# Patient Record
Sex: Female | Born: 1990 | Race: Black or African American | Hispanic: No | Marital: Single | Smoking: Never smoker
Health system: Southern US, Community
[De-identification: ages and names within clinical notes are randomized; demographics above are authoritative.]

## PROBLEM LIST (undated history)

## (undated) DIAGNOSIS — I10 Essential (primary) hypertension: Secondary | ICD-10-CM

## (undated) HISTORY — PX: FACIAL FRACTURE SURGERY: SHX1570

---

## 2013-09-08 ENCOUNTER — Emergency Department (HOSPITAL_BASED_OUTPATIENT_CLINIC_OR_DEPARTMENT_OTHER)
Admission: EM | Admit: 2013-09-08 | Discharge: 2013-09-08 | Disposition: A | Discharge status: Home / Self Care | Payer: Medicaid Other | Attending: Emergency Medicine | Admitting: Emergency Medicine

## 2013-09-08 ENCOUNTER — Encounter (HOSPITAL_BASED_OUTPATIENT_CLINIC_OR_DEPARTMENT_OTHER): Payer: Self-pay | Admitting: Emergency Medicine

## 2013-09-08 DIAGNOSIS — Z3202 Encounter for pregnancy test, result negative: Secondary | ICD-10-CM | POA: Insufficient documentation

## 2013-09-08 DIAGNOSIS — I10 Essential (primary) hypertension: Secondary | ICD-10-CM | POA: Insufficient documentation

## 2013-09-08 DIAGNOSIS — R110 Nausea: Secondary | ICD-10-CM | POA: Insufficient documentation

## 2013-09-08 DIAGNOSIS — N390 Urinary tract infection, site not specified: Secondary | ICD-10-CM | POA: Insufficient documentation

## 2013-09-08 HISTORY — DX: Essential (primary) hypertension: I10

## 2013-09-08 LAB — URINE MICROSCOPIC-ADD ON

## 2013-09-08 LAB — BASIC METABOLIC PANEL
BUN: 11 mg/dL (ref 6–23)
Chloride: 98 mEq/L (ref 96–112)
Creatinine, Ser: 0.9 mg/dL (ref 0.50–1.10)
GFR calc Af Amer: >90 mL/min (ref >90)
GFR calc non Af Amer: >90 mL/min (ref >90)
Glucose, Bld: 95 mg/dL (ref 70–99)
Potassium: 3.7 mEq/L (ref 3.5–5.1)
Sodium: 137 mEq/L (ref 135–145)

## 2013-09-08 LAB — PREGNANCY, URINE: Preg Test, Ur: NEGATIVE

## 2013-09-08 LAB — CBC WITH DIFFERENTIAL/PLATELET
Basophils Relative: 0 % (ref 0–1)
Eosinophils Absolute: 0 10*3/uL (ref 0.0–0.7)
Hemoglobin: 13.9 g/dL (ref 12.0–15.0)
Lymphs Abs: 1.6 10*3/uL (ref 0.7–4.0)
MCH: 29.2 pg (ref 26.0–34.0)
MCV: 87.4 fL (ref 78.0–100.0)
Monocytes Relative: 12 % (ref 3–12)
Neutro Abs: 3.8 10*3/uL (ref 1.7–7.7)
Neutrophils Relative %: 62 % (ref 43–77)
Platelets: 244 10*3/uL (ref 150–400)
RBC: 4.76 MIL/uL (ref 3.87–5.11)

## 2013-09-08 LAB — URINALYSIS, ROUTINE W REFLEX MICROSCOPIC
Glucose, UA: NEGATIVE mg/dL
Nitrite: NEGATIVE
Specific Gravity, Urine: 1.029 (ref 1.005–1.030)
pH: 5.5 (ref 5.0–8.0)

## 2013-09-08 MED ORDER — SODIUM CHLORIDE 0.9 % IV BOLUS (SEPSIS)
1000.0000 mL | Freq: Once | INTRAVENOUS | Status: AC
  Administered 2013-09-08: 1000 mL via INTRAVENOUS

## 2013-09-08 MED ORDER — ACETAMINOPHEN 325 MG PO TABS
650.0000 mg | ORAL_TABLET | Freq: Once | ORAL | Status: DC
  Filled 2013-09-08: qty 2

## 2013-09-08 MED ORDER — CEPHALEXIN 500 MG PO CAPS: 500.0000 mg | ORAL_CAPSULE | Freq: Four times a day (QID) | ORAL | Status: DC

## 2013-09-08 MED ORDER — CEFTRIAXONE SODIUM 1 G IJ SOLR
INTRAMUSCULAR | Status: AC
  Filled 2013-09-08: qty 10

## 2013-09-08 MED ORDER — ACETAMINOPHEN 325 MG PO TABS
650.0000 mg | ORAL_TABLET | Freq: Once | ORAL | Status: AC
  Administered 2013-09-08: 650 mg via ORAL

## 2013-09-08 MED ORDER — DEXTROSE 5 % IV SOLN
1.0000 g | Freq: Once | INTRAVENOUS | Status: AC
  Administered 2013-09-08: 1 g via INTRAVENOUS

## 2013-09-08 NOTE — ED Notes (Signed)
No adverse effects noted to IV antibiotic.

## 2013-09-08 NOTE — ED Provider Notes (Signed)
Medical screening examination/treatment/procedure(s) were performed by non-physician practitioner and as supervising physician I was immediately available for consultation/collaboration.  EKG Interpretation   None        Charles B. Sheldon, MD 09/08/13 2046 

## 2013-09-08 NOTE — ED Provider Notes (Signed)
CSN: [id number]     Arrival date & time 09/08/13  1642 History   First MD Initiated Contact with Patient 09/08/13 1759     Chief Complaint  Patient presents with  . Dysuria   (Consider location/radiation/quality/duration/timing/severity/associated sxs/prior Treatment) Patient is a 22 y.o. female presenting with dysuria. The history is provided by the patient. No language interpreter was used.  Dysuria Pain quality:  Aching and burning Associated symptoms: fever and nausea   Associated symptoms: no abdominal pain, no flank pain, no vaginal discharge and no vomiting   Associated symptoms comment:  Burning with urination for the past 2 days. She denies vaginal discharge, abnormal bleeding, abdominal or pelvic pain. She has had mild nausea without vomiting. She reports only known fever was on arrival in ED, 102. She denies congestion, cough, sore throat, flank pain.   Past Medical History  Diagnosis Date  . Hypertension    History reviewed. No pertinent past surgical history. No family history on file. History  Substance Use Topics  . Smoking status: Never Smoker   . Smokeless tobacco: Not on file  . Alcohol Use: Not on file   OB History   Grav Para Term Preterm Abortions TAB SAB Ect Mult Living                 Review of Systems  Constitutional: Positive for fever.  HENT: Negative for congestion and sore throat.   Respiratory: Negative for cough and shortness of breath.   Gastrointestinal: Positive for nausea. Negative for vomiting and abdominal pain.  Genitourinary: Positive for dysuria and frequency. Negative for flank pain, vaginal bleeding, vaginal discharge, pelvic pain and dyspareunia.  Musculoskeletal: Negative for myalgias.  Neurological: Negative for headaches.    Allergies  Review of patient's allergies indicates no known allergies.  Home Medications  No current outpatient prescriptions on file. BP 141/73  Pulse 115  Temp(Src) 100.3 F (37.9 C) (Oral)  Resp  18  Wt 325 lb 6.4 oz (147.6 kg)  SpO2 99%  LMP 07/02/2013 Physical Exam  Constitutional: She is oriented to person, place, and time. She appears well-developed and well-nourished. No distress.  HENT:  Head: Normocephalic.  Mouth/Throat: Oropharynx is clear and moist.  Neck: Normal range of motion.  Cardiovascular: Normal rate and regular rhythm.   No murmur heard. Pulmonary/Chest: Effort normal. No respiratory distress. She has no wheezes. She has no rales.  Abdominal: Soft. There is no tenderness. There is no rebound.  Neurological: She is alert and oriented to person, place, and time.  Skin: Skin is warm and dry.  Psychiatric: She has a normal mood and affect.    ED Course  Procedures (including critical care time) Labs Review Labs Reviewed  URINALYSIS, ROUTINE W REFLEX MICROSCOPIC - Abnormal; Notable for the following:    APPearance CLOUDY (*)    Hgb urine dipstick SMALL (*)    Protein, ur 30 (*)    Leukocytes, UA MODERATE (*)    All other components within normal limits  URINE MICROSCOPIC-ADD ON - Abnormal; Notable for the following:    Squamous Epithelial / LPF FEW (*)    Bacteria, UA MANY (*)    All other components within normal limits  URINE CULTURE  PREGNANCY, URINE  CBC WITH DIFFERENTIAL  BASIC METABOLIC PANEL   Results for orders placed during the hospital encounter of 09/08/13  URINALYSIS, ROUTINE W REFLEX MICROSCOPIC      Result Value Range   Color, Urine YELLOW  YELLOW   APPearance CLOUDY (*)  CLEAR   Specific Gravity, Urine 1.029  1.005 - 1.030   pH 5.5  5.0 - 8.0   Glucose, UA NEGATIVE  NEGATIVE mg/dL   Hgb urine dipstick SMALL (*) NEGATIVE   Bilirubin Urine NEGATIVE  NEGATIVE   Ketones, ur NEGATIVE  NEGATIVE mg/dL   Protein, ur 30 (*) NEGATIVE mg/dL   Urobilinogen, UA 0.2  0.0 - 1.0 mg/dL   Nitrite NEGATIVE  NEGATIVE   Leukocytes, UA MODERATE (*) NEGATIVE  PREGNANCY, URINE      Result Value Range   Preg Test, Ur NEGATIVE  NEGATIVE  URINE  MICROSCOPIC-ADD ON      Result Value Range   Squamous Epithelial / LPF FEW (*) RARE   WBC, UA 3-6  <3 WBC/hpf   RBC / HPF 0-2  <3 RBC/hpf   Bacteria, UA MANY (*) RARE   Urine-Other MUCOUS PRESENT    CBC WITH DIFFERENTIAL      Result Value Range   WBC 6.1  4.0 - 10.5 K/uL   RBC 4.76  3.87 - 5.11 MIL/uL   Hemoglobin 13.9  12.0 - 15.0 g/dL   HCT 21.3  08.6 - 57.8 %   MCV 87.4  78.0 - 100.0 fL   MCH 29.2  26.0 - 34.0 pg   MCHC 33.4  30.0 - 36.0 g/dL   RDW 46.9  62.9 - 52.8 %   Platelets 244  150 - 400 K/uL   Neutrophils Relative % 62  43 - 77 %   Neutro Abs 3.8  1.7 - 7.7 K/uL   Lymphocytes Relative 26  12 - 46 %   Lymphs Abs 1.6  0.7 - 4.0 K/uL   Monocytes Relative 12  3 - 12 %   Monocytes Absolute 0.8  0.1 - 1.0 K/uL   Eosinophils Relative 0  0 - 5 %   Eosinophils Absolute 0.0  0.0 - 0.7 K/uL   Basophils Relative 0  0 - 1 %   Basophils Absolute 0.0  0.0 - 0.1 K/uL  BASIC METABOLIC PANEL      Result Value Range   Sodium 137  135 - 145 mEq/L   Potassium 3.7  3.5 - 5.1 mEq/L   Chloride 98  96 - 112 mEq/L   CO2 26  19 - 32 mEq/L   Glucose, Bld 95  70 - 99 mg/dL   BUN 11  6 - 23 mg/dL   Creatinine, Ser 4.13  0.50 - 1.10 mg/dL   Calcium 9.2  8.4 - 24.4 mg/dL   GFR calc non Af Amer >90  >90 mL/min   GFR calc Af Amer >90  >90 mL/min    Imaging Review No results found.  EKG Interpretation   None       MDM  No diagnosis found. 1. UTI  FEver improved, IV fluids given with improved VS. She feels better. Lab studies unremarkable. Stable for discharge. She has community follow up for recheck this week.     Arnoldo Hooker, PA-C 09/08/13 2032

## 2013-09-08 NOTE — ED Notes (Signed)
Patient here with dysuria and frequency x 2 days, chills with same, denies discharge

## 2013-09-10 LAB — URINE CULTURE

## 2013-11-09 ENCOUNTER — Encounter (HOSPITAL_BASED_OUTPATIENT_CLINIC_OR_DEPARTMENT_OTHER): Payer: Self-pay | Admitting: Emergency Medicine

## 2013-11-09 ENCOUNTER — Emergency Department (HOSPITAL_BASED_OUTPATIENT_CLINIC_OR_DEPARTMENT_OTHER)
Admission: EM | Admit: 2013-11-09 | Discharge: 2013-11-09 | Disposition: A | Discharge status: Home / Self Care | Payer: Medicaid Other | Attending: Emergency Medicine | Admitting: Emergency Medicine

## 2013-11-09 DIAGNOSIS — N760 Acute vaginitis: Secondary | ICD-10-CM | POA: Insufficient documentation

## 2013-11-09 DIAGNOSIS — B9689 Other specified bacterial agents as the cause of diseases classified elsewhere: Secondary | ICD-10-CM | POA: Insufficient documentation

## 2013-11-09 DIAGNOSIS — I498 Other specified cardiac arrhythmias: Secondary | ICD-10-CM | POA: Insufficient documentation

## 2013-11-09 DIAGNOSIS — I10 Essential (primary) hypertension: Secondary | ICD-10-CM | POA: Insufficient documentation

## 2013-11-09 DIAGNOSIS — A499 Bacterial infection, unspecified: Secondary | ICD-10-CM | POA: Insufficient documentation

## 2013-11-09 DIAGNOSIS — Z792 Long term (current) use of antibiotics: Secondary | ICD-10-CM | POA: Insufficient documentation

## 2013-11-09 DIAGNOSIS — Z202 Contact with and (suspected) exposure to infections with a predominantly sexual mode of transmission: Secondary | ICD-10-CM

## 2013-11-09 LAB — WET PREP, GENITAL
Trich, Wet Prep: NONE SEEN
Yeast Wet Prep HPF POC: NONE SEEN

## 2013-11-09 MED ORDER — AZITHROMYCIN 250 MG PO TABS
1000.0000 mg | ORAL_TABLET | Freq: Once | ORAL | Status: AC
Start: 2013-11-09 — End: 2013-11-09
  Administered 2013-11-09: 1000 mg via ORAL
  Filled 2013-11-09: qty 4

## 2013-11-09 MED ORDER — METRONIDAZOLE 500 MG PO TABS: 500.0000 mg | ORAL_TABLET | Freq: Two times a day (BID) | ORAL | Status: AC

## 2013-11-09 NOTE — ED Notes (Signed)
Pelvic cart is at the bedside set up and ready for the doctor to use. 

## 2013-11-09 NOTE — ED Notes (Signed)
Pt states she wants to be checked for STD's.   Pt denies vaginal discharge.

## 2013-11-09 NOTE — ED Provider Notes (Signed)
CSN: [id number]     Arrival date & time 11/09/13  1256 History   First MD Initiated Contact with Patient 11/09/13 1306     Chief Complaint  Patient presents with  . Exposure to STD   (Consider location/radiation/quality/duration/timing/severity/associated sxs/prior Treatment) Patient is a 23 y.o. female presenting with STD exposure. The history is provided by the patient.  Exposure to STD This is a new problem. The current episode started in the past 7 days.   Sheryl Little is a 23 y.o. female who presents to the ED requesting STD testing. She states that her boyfriend called her and told her he had nongonococcal urethritis and she needed to come in for STD testing.  She denies any symptoms. Menses started today.     Past Medical History  Diagnosis Date  . Hypertension    History reviewed. No pertinent past surgical history. No family history on file. History  Substance Use Topics  . Smoking status: Never Smoker   . Smokeless tobacco: Not on file  . Alcohol Use: No   OB History   Grav Para Term Preterm Abortions TAB SAB Ect Mult Living                 Review of Systems Negative as stated in HPI  Allergies  Review of patient's allergies indicates no known allergies.  Home Medications   Current Outpatient Rx  Name  Route  Sig  Dispense  Refill  . cephALEXin (KEFLEX) 500 MG capsule   Oral   Take 1 capsule (500 mg total) by mouth 4 (four) times daily.   20 capsule   0    BP 121/65  Pulse 57  Temp(Src) 98.8 F (37.1 C) (Oral)  Ht 5\' 4"  (1.626 m)  SpO2 96%  LMP 10/04/2013 Physical Exam  Nursing note and vitals reviewed. Constitutional: She is oriented to person, place, and time. She appears well-developed and well-nourished.  HENT:  Head: Normocephalic and atraumatic.  Eyes: EOM are normal.  Neck: Neck supple.  Cardiovascular: Bradycardia present.   Pulmonary/Chest: Effort normal.  Abdominal: Soft. There is no tenderness.  Genitourinary:  External  genitalia without lesions. Small amount of vaginal bleeding. No CMT, no adnexal tenderness, uterus without palpable enlargement.   Musculoskeletal: Normal range of motion.  Neurological: She is alert and oriented to person, place, and time. No cranial nerve deficit.  Skin: Skin is warm and dry.  Psychiatric: She has a normal mood and affect. Her behavior is normal.   Results for orders placed during the hospital encounter of 11/09/13 (from the past 24 hour(s))  WET PREP, GENITAL     Status: Abnormal   Collection Time    11/09/13  2:19 PM      Result Value Range   Yeast Wet Prep HPF POC NONE SEEN  NONE SEEN   Trich, Wet Prep NONE SEEN  NONE SEEN   Clue Cells Wet Prep HPF POC MODERATE (*) NONE SEEN   WBC, Wet Prep HPF POC MANY (*) NONE SEEN     ED Course  Procedures   MDM  23 y.o. female with STD exposure. Cultures for GC and Chlamydia sent. Will give Zithromax 1 gram here prior to discharge. Will treat for BV. I have reviewed this patient's vital signs, nurses notes, appropriate labs and discussed findings and plan of care with the patient. She will follow up with the health department for further screening for HIV, Hepatitis and syphilis.    Medication List    TAKE  these medications       metroNIDAZOLE 500 MG tablet  Commonly known as:  FLAGYL  Take 1 tablet (500 mg total) by mouth 2 (two) times daily.      ASK your doctor about these medications       cephALEXin 500 MG capsule  Commonly known as:  KEFLEX  Take 1 capsule (500 mg total) by mouth 4 (four) times daily.           [address] [place], [place] 11/10/13 [phone]

## 2013-11-09 NOTE — ED Notes (Signed)
Here requesting an evaluation, states her boyfriend tested positive for Non-Gonococcal Urethritis.

## 2013-11-09 NOTE — Discharge Instructions (Signed)
We are treating you with an antibiotic here today to cover you for Chlamydia. We are giving you a prescription for an antibiotic for your bacterial vaginosis. You can follow up with the Health Department for further STD tests such as HIV, Hepatitis and Syphilis. Return here as needed for problems. If you need additional antibiotics when the cultures are back someone will call you.

## 2013-11-10 NOTE — ED Provider Notes (Signed)
Medical screening examination/treatment/procedure(s) were performed by non-physician practitioner and as supervising physician I was immediately available for consultation/collaboration.  EKG Interpretation   None         Sheryl B. Bernette MayersSheldon, MD 11/10/13 [phone]

## 2013-11-12 LAB — GC/CHLAMYDIA PROBE AMP
CT PROBE, AMP APTIMA: POSITIVE — AB
GC Probe RNA: NEGATIVE

## 2013-11-13 NOTE — ED Notes (Addendum)
+   Chlamydia Patient [facility] with [facility]ax [facility]

## 2014-03-12 ENCOUNTER — Emergency Department (HOSPITAL_BASED_OUTPATIENT_CLINIC_OR_DEPARTMENT_OTHER)
Admission: EM | Admit: 2014-03-12 | Discharge: 2014-03-12 | Disposition: A | Discharge status: Home / Self Care | Payer: Medicaid Other | Attending: Emergency Medicine | Admitting: Emergency Medicine

## 2014-03-12 ENCOUNTER — Encounter (HOSPITAL_BASED_OUTPATIENT_CLINIC_OR_DEPARTMENT_OTHER): Payer: Self-pay | Admitting: Emergency Medicine

## 2014-03-12 DIAGNOSIS — R21 Rash and other nonspecific skin eruption: Secondary | ICD-10-CM | POA: Insufficient documentation

## 2014-03-12 DIAGNOSIS — I10 Essential (primary) hypertension: Secondary | ICD-10-CM | POA: Insufficient documentation

## 2014-03-12 DIAGNOSIS — Z792 Long term (current) use of antibiotics: Secondary | ICD-10-CM | POA: Diagnosis not present

## 2014-03-12 MED ORDER — SULFAMETHOXAZOLE-TRIMETHOPRIM 800-160 MG PO TABS: 1.0000 | ORAL_TABLET | Freq: Two times a day (BID) | ORAL | Status: AC

## 2014-03-12 MED ORDER — CEPHALEXIN 500 MG PO CAPS: 500.0000 mg | ORAL_CAPSULE | Freq: Four times a day (QID) | ORAL | Status: DC

## 2014-03-12 NOTE — ED Provider Notes (Signed)
CSN: [id number]     Arrival date & time 03/12/14  1127 History   First MD Initiated Contact with Patient 03/12/14 1222     Chief Complaint  Patient presents with  . Rash     (Consider location/radiation/quality/duration/timing/severity/associated sxs/prior Treatment) HPI Comments: Pt states that she started with an red bump on her right eyelid 2 days ago. Pt state it looked like a bite and she has been squeezing the area and it seem to be getting worse. Pt states that it is a little bit sore today. Denies vision problems. No fever. No eye drainage.pt states that the area has been a little crusted  The history is provided by the patient. No language interpreter was used.    Past Medical History  Diagnosis Date  . Hypertension    Past Surgical History  Procedure Laterality Date  . Facial fracture surgery     No family history on file. History  Substance Use Topics  . Smoking status: Never Smoker   . Smokeless tobacco: Not on file  . Alcohol Use: No   OB History   Grav Para Term Preterm Abortions TAB SAB Ect Mult Living                 Review of Systems  Constitutional: Negative.   Respiratory: Negative.   Cardiovascular: Negative.       Allergies  Review of patient's allergies indicates no known allergies.  Home Medications   Prior to Admission medications   Medication Sig Start Date End Date Taking? Authorizing Provider  cephALEXin (KEFLEX) 500 MG capsule Take 1 capsule (500 mg total) by mouth 4 (four) times daily. 09/08/13   Shari A Upstill, PA-C  cephALEXin (KEFLEX) 500 MG capsule Take 1 capsule (500 mg total) by mouth 4 (four) times daily. 03/12/14   Teressa Lower, NP  metroNIDAZOLE (FLAGYL) 500 MG tablet Take 1 tablet (500 mg total) by mouth 2 (two) times daily. 11/09/13   Hope Orlene Och, NP  sulfamethoxazole-trimethoprim (SEPTRA DS) 800-160 MG per tablet Take 1 tablet by mouth every 12 (twelve) hours. 03/12/14   Teressa Lower, NP   BP 144/73  Pulse 104   Temp(Src) 99 F (37.2 C) (Oral)  Resp 20  Ht 5\' 4"  (1.626 m)  Wt 325 lb (147.419 kg)  BMI 55.76 kg/m2  SpO2 99%  LMP 02/10/2014 Physical Exam  Nursing note and vitals reviewed. Constitutional: She is oriented to person, place, and time. She appears well-developed and well-nourished.  Eyes: Conjunctivae and EOM are normal. Pupils are equal, round, and reactive to light.  Cardiovascular: Normal rate and regular rhythm.   Pulmonary/Chest: Effort normal and breath sounds normal.  Neurological: She is alert and oriented to person, place, and time.  Skin:  1cm in diameter area to the right eyelid with crusting and drainage    ED Course  Procedures (including critical care time) Labs Review Labs Reviewed - No data to display  Imaging Review No results found.   EKG Interpretation None      MDM   Final diagnoses:  Rash    Considered shingles although not consistent with that. More consistent with folliculitis. No conjunctival involvement. Discussed return symptoms with pt and she verbalized understanding    Teressa Lower, NP 03/12/14 1244

## 2014-03-12 NOTE — ED Notes (Signed)
Rash on right eye lid that started 2 days ago.  States she thinks she was bitten by something.

## 2014-03-12 NOTE — Discharge Instructions (Signed)

## 2014-03-13 NOTE — ED Provider Notes (Signed)
Medical screening examination/treatment/procedure(s) were performed by non-physician practitioner and as supervising physician I was immediately available for consultation/collaboration.   EKG Interpretation None        Candyce Churn III, MD 03/13/14 [phone]

## 2014-05-30 ENCOUNTER — Emergency Department (HOSPITAL_BASED_OUTPATIENT_CLINIC_OR_DEPARTMENT_OTHER)
Admission: EM | Admit: 2014-05-30 | Discharge: 2014-05-30 | Disposition: A | Discharge status: Home / Self Care | Payer: Medicaid Other | Attending: Emergency Medicine | Admitting: Emergency Medicine

## 2014-05-30 ENCOUNTER — Encounter (HOSPITAL_BASED_OUTPATIENT_CLINIC_OR_DEPARTMENT_OTHER): Payer: Self-pay | Admitting: Emergency Medicine

## 2014-05-30 DIAGNOSIS — I10 Essential (primary) hypertension: Secondary | ICD-10-CM | POA: Insufficient documentation

## 2014-05-30 DIAGNOSIS — M545 Low back pain, unspecified: Secondary | ICD-10-CM | POA: Diagnosis not present

## 2014-05-30 DIAGNOSIS — Z792 Long term (current) use of antibiotics: Secondary | ICD-10-CM | POA: Diagnosis not present

## 2014-05-30 DIAGNOSIS — M722 Plantar fascial fibromatosis: Secondary | ICD-10-CM | POA: Insufficient documentation

## 2014-05-30 NOTE — ED Provider Notes (Signed)
CSN: [id number]     Arrival date & time 05/30/14  1422 History   First MD Initiated Contact with Patient 05/30/14 1506     Chief Complaint  Patient presents with  . Back Pain     (Consider location/radiation/quality/duration/timing/severity/associated sxs/prior Treatment) Patient is a 23 y.o. female presenting with back pain. The history is provided by the patient.  Back Pain Location:  Lumbar spine Quality:  Aching Pain severity:  Moderate Pain is:  Same all the time Onset quality:  Gradual Timing:  Constant Progression:  Waxing and waning Context: not falling, not jumping from heights, not lifting heavy objects, not MVA, not recent illness, not recent injury and not twisting   Relieved by:  Being still Worsened by:  Sitting and twisting Ineffective treatments:  None tried Associated symptoms: no abdominal pain, no abdominal swelling, no bladder incontinence, no bowel incontinence, no chest pain, no fever, no headaches, no leg pain, no numbness, no paresthesias, no tingling, no weakness and no weight loss   Risk factors: no hx of cancer, no hx of osteoporosis, no lack of exercise, no menopause, not obese, not pregnant, no recent surgery, no steroid use and no vascular disease     Past Medical History  Diagnosis Date  . Hypertension    Past Surgical History  Procedure Laterality Date  . Facial fracture surgery     No family history on file. History  Substance Use Topics  . Smoking status: Never Smoker   . Smokeless tobacco: Not on file  . Alcohol Use: No   OB History   Grav Para Term Preterm Abortions TAB SAB Ect Mult Living                 Review of Systems  Constitutional: Negative for fever and weight loss.  Cardiovascular: Negative for chest pain.  Gastrointestinal: Negative for abdominal pain and bowel incontinence.  Genitourinary: Negative for bladder incontinence.  Musculoskeletal: Positive for back pain.  Neurological: Negative for tingling, weakness,  numbness, headaches and paresthesias.  All other systems reviewed and are negative.     Allergies  Review of patient's allergies indicates no known allergies.  Home Medications   Prior to Admission medications   Medication Sig Start Date End Date Taking? Authorizing Provider  cephALEXin (KEFLEX) 500 MG capsule Take 1 capsule (500 mg total) by mouth 4 (four) times daily. 09/08/13   Shari A Upstill, PA-C  cephALEXin (KEFLEX) 500 MG capsule Take 1 capsule (500 mg total) by mouth 4 (four) times daily. 03/12/14   Teressa Lower, NP  metroNIDAZOLE (FLAGYL) 500 MG tablet Take 1 tablet (500 mg total) by mouth 2 (two) times daily. 11/09/13   Hope Orlene Och, NP  sulfamethoxazole-trimethoprim (SEPTRA DS) 800-160 MG per tablet Take 1 tablet by mouth every 12 (twelve) hours. 03/12/14   Teressa Lower, NP   BP 119/85  Pulse 97  Temp(Src) 98.9 F (37.2 C)  Resp 18  Wt 260 lb (117.935 kg)  SpO2 100%  LMP 04/16/2014 Physical Exam  Nursing note and vitals reviewed. Constitutional: She is oriented to person, place, and time. She appears well-developed and well-nourished.  Morbidly obese  HENT:  Head: Normocephalic and atraumatic.  Right Ear: External ear normal.  Left Ear: External ear normal.  Nose: Nose normal.  Mouth/Throat: Oropharynx is clear and moist.  Eyes: Conjunctivae and EOM are normal. Pupils are equal, round, and reactive to light.  Neck: Normal range of motion. Neck supple.  Cardiovascular: Normal rate, regular rhythm, normal heart sounds  and intact distal pulses.   Pulmonary/Chest: Effort normal and breath sounds normal.  Abdominal: Soft. Bowel sounds are normal.  Musculoskeletal: Normal range of motion.       Feet:  Right foot pain increased with flexion and extension No calf or leg swelling, ttp, or redness Pulsed intact  Neurological: She is alert and oriented to person, place, and time. She has normal reflexes. She displays normal reflexes. No cranial nerve deficit. She  exhibits normal muscle tone. Coordination normal.  Skin: Skin is warm and dry.  Psychiatric: She has a normal mood and affect. Her behavior is normal. Judgment and thought content normal.    ED Course  Procedures (including critical care time)   MDM   Final diagnoses:  Midline low back pain without sciatica  Plantar fasciitis, right   23 y.o. Female with morbid obesity with back pain for weeks gradually worsening with no trauma that appears musculoskeletal in origin.  Patient with back pain.  No neurological deficits and normal neuro exam.  Patient can walk with normal gait.  No loss of bowel orbladder control.  No concern for cauda equina.  No fever, night sweats, weight loss, h/o cancer, IVDU.  RICE protocol and pain medicine indicated and discussed with patient. Patient with some ttp right heel and symptoms c.w. Plantar fasciitis.      Hilario Quarryanielle S Emanuella Nickle, MD 06/02/14 [phone]

## 2014-05-30 NOTE — ED Notes (Signed)
C/o low mid back pain and right foot swelling x 2 months. Also c/o abd cramping for 2 months. Had a 3 wk period last month. Denies injury to back.

## 2014-05-30 NOTE — Discharge Instructions (Signed)
Back Exercises Back exercises help treat and prevent back injuries. The goal of back exercises is to increase the strength of your abdominal and back muscles and the flexibility of your back. These exercises should be started when you no longer have back pain. Back exercises include:  Pelvic Tilt. Lie on your back with your knees bent. Tilt your pelvis until the lower part of your back is against the floor. Hold this position 5 to 10 sec and repeat 5 to 10 times.  Knee to Chest. Pull first 1 knee up against your chest and hold for 20 to 30 seconds, repeat this with the other knee, and then both knees. This may be done with the other leg straight or bent, whichever feels better.  Sit-Ups or Curl-Ups. Bend your knees 90 degrees. Start with tilting your pelvis, and do a partial, slow sit-up, lifting your trunk only 30 to 45 degrees off the floor. Take at least 2 to 3 seconds for each sit-up. Do not do sit-ups with your knees out straight. If partial sit-ups are difficult, simply do the above but with only tightening your abdominal muscles and holding it as directed.  Hip-Lift. Lie on your back with your knees flexed 90 degrees. Push down with your feet and shoulders as you raise your hips a couple inches off the floor; hold for 10 seconds, repeat 5 to 10 times.  Back arches. Lie on your stomach, propping yourself up on bent elbows. Slowly press on your hands, causing an arch in your low back. Repeat 3 to 5 times. Any initial stiffness and discomfort should lessen with repetition over time.  Shoulder-Lifts. Lie face down with arms beside your body. Keep hips and torso pressed to floor as you slowly lift your head and shoulders off the floor. Do not overdo your exercises, especially in the beginning. Exercises may cause you some mild back discomfort which lasts for a few minutes; however, if the pain is more severe, or lasts for more than 15 minutes, do not continue exercises until you see your caregiver.  Improvement with exercise therapy for back problems is slow.  See your caregivers for assistance with developing a proper back exercise program. Document Released: 11/10/2004 Document Revised: 12/26/2011 Document Reviewed: 08/04/2011 [facility] Patient Information 2015 [place], [place]. This information is not intended to replace advice given to you by your health care provider. Make sure you discuss any questions you have with your health care provider. Plantar Fasciitis Plantar fasciitis is a common condition that causes foot pain. It is soreness (inflammation) of the band of tough fibrous tissue on the bottom of the foot that runs from the heel bone (calcaneus) to the ball of the foot. The cause of this soreness may be from excessive standing, poor fitting shoes, running on hard surfaces, being overweight, having an abnormal walk, or overuse (this is common in runners) of the painful foot or feet. It is also common in aerobic exercise dancers and ballet dancers. SYMPTOMS  Most people with plantar fasciitis complain of:  Severe pain in the morning on the bottom of their foot especially when taking the first steps out of bed. This pain recedes after a few minutes of walking.  Severe pain is experienced also during walking following a long period of inactivity.  Pain is worse when walking barefoot or up stairs DIAGNOSIS   Your caregiver will diagnose this condition by examining and feeling your foot.  Special tests such as X-rays of your foot, are usually not needed.  PREVENTION   Consult a sports medicine professional before beginning a new exercise program.  Walking programs offer a good workout. With walking there is a lower chance of overuse injuries common to runners. There is less impact and less jarring of the joints.  Begin all new exercise programs slowly. If problems or pain develop, decrease the amount of time or distance until you are at a comfortable level.  Wear good shoes and  replace them regularly.  Stretch your foot and the heel cords at the back of the ankle (Achilles tendon) both before and after exercise.  Run or exercise on even surfaces that are not hard. For example, asphalt is better than pavement.  Do not run barefoot on hard surfaces.  If using a treadmill, vary the incline.  Do not continue to workout if you have foot or joint problems. Seek professional help if they do not improve. HOME CARE INSTRUCTIONS   Avoid activities that cause you pain until you recover.  Use ice or cold packs on the problem or painful areas after working out.  Only take over-the-counter or prescription medicines for pain, discomfort, or fever as directed by your caregiver.  Soft shoe inserts or athletic shoes with air or gel sole cushions may be helpful.  If problems continue or become more severe, consult a sports medicine caregiver or your own health care provider. Cortisone is a potent anti-inflammatory medication that may be injected into the painful area. You can discuss this treatment with your caregiver. MAKE SURE YOU:   Understand these instructions.  Will watch your condition.  Will get help right away if you are not doing well or get worse. Document Released: 06/28/2001 Document Revised: 12/26/2011 Document Reviewed: 08/27/2008 [facility] Patient Information 2015 [place], [place]. This information is not intended to replace advice given to you by your health care provider. Make sure you discuss any questions you have with your health care provider.

## 2014-05-30 NOTE — ED Notes (Signed)
EDP at patient bedside for assessment/update  

## 2015-03-18 ENCOUNTER — Emergency Department (HOSPITAL_BASED_OUTPATIENT_CLINIC_OR_DEPARTMENT_OTHER)
Admission: EM | Admit: 2015-03-18 | Discharge: 2015-03-18 | Disposition: A | Discharge status: Home / Self Care | Payer: Medicaid Other | Attending: Emergency Medicine | Admitting: Emergency Medicine

## 2015-03-18 ENCOUNTER — Encounter (HOSPITAL_BASED_OUTPATIENT_CLINIC_OR_DEPARTMENT_OTHER): Payer: Self-pay | Admitting: *Deleted

## 2015-03-18 ENCOUNTER — Emergency Department (HOSPITAL_BASED_OUTPATIENT_CLINIC_OR_DEPARTMENT_OTHER): Payer: Medicaid Other

## 2015-03-18 DIAGNOSIS — K219 Gastro-esophageal reflux disease without esophagitis: Secondary | ICD-10-CM | POA: Diagnosis not present

## 2015-03-18 DIAGNOSIS — Z79899 Other long term (current) drug therapy: Secondary | ICD-10-CM | POA: Insufficient documentation

## 2015-03-18 DIAGNOSIS — Z792 Long term (current) use of antibiotics: Secondary | ICD-10-CM | POA: Diagnosis not present

## 2015-03-18 DIAGNOSIS — I10 Essential (primary) hypertension: Secondary | ICD-10-CM | POA: Insufficient documentation

## 2015-03-18 DIAGNOSIS — IMO0001 Reserved for inherently not codable concepts without codable children: Secondary | ICD-10-CM

## 2015-03-18 DIAGNOSIS — R0789 Other chest pain: Secondary | ICD-10-CM

## 2015-03-18 DIAGNOSIS — R079 Chest pain, unspecified: Secondary | ICD-10-CM | POA: Diagnosis present

## 2015-03-18 NOTE — ED Provider Notes (Signed)
CSN: [id number]     Arrival date & time 03/18/15  1830 History   First MD Initiated Contact with Patient 03/18/15 1904     Chief Complaint  Patient presents with  . Chest Pain     (Consider location/radiation/quality/duration/timing/severity/associated sxs/prior Treatment) HPI Comments: Pt comes in with c/o substernal cp cough and throat burning for the last couple of days. She denies fever. She states that it feels similar to her last pneumonia.. She denies vomiting, or sob. She states that she has been having intermittent problems with hoarseness.  The history is provided by the patient. No language interpreter was used.    Past Medical History  Diagnosis Date  . Hypertension    Past Surgical History  Procedure Laterality Date  . Facial fracture surgery     No family history on file. History  Substance Use Topics  . Smoking status: Never Smoker   . Smokeless tobacco: Not on file  . Alcohol Use: No   OB History    No data available     Review of Systems  All other systems reviewed and are negative.     Allergies  Review of patient's allergies indicates no known allergies.  Home Medications   Prior to Admission medications   Medication Sig Start Date End Date Taking? Authorizing Provider  Vitamin D, Ergocalciferol, (DRISDOL) 50000 UNITS CAPS capsule Take 50,000 Units by mouth every 7 (seven) days.   Yes Historical Provider, MD  cephALEXin (KEFLEX) 500 MG capsule Take 1 capsule (500 mg total) by mouth 4 (four) times daily. 09/08/13   Elpidio Anis, PA-C  cephALEXin (KEFLEX) 500 MG capsule Take 1 capsule (500 mg total) by mouth 4 (four) times daily. 03/12/14   Teressa Lower, NP  metroNIDAZOLE (FLAGYL) 500 MG tablet Take 1 tablet (500 mg total) by mouth 2 (two) times daily. 11/09/13   Hope Orlene Och, NP  sulfamethoxazole-trimethoprim (SEPTRA DS) 800-160 MG per tablet Take 1 tablet by mouth every 12 (twelve) hours. 03/12/14   Teressa Lower, NP   BP 129/90 mmHg  Pulse  102  Temp(Src) 99.5 F (37.5 C) (Oral)  Resp 18  Ht  (1.626 m)  Wt 318 lb (144.244 kg)  BMI 54.56 kg/m2  SpO2 100% Physical Exam  Constitutional: She is oriented to person, place, and time. She appears well-developed and well-nourished.  HENT:  Head: Normocephalic and atraumatic.  Right Ear: External ear normal.  Left Ear: External ear normal.  Mouth/Throat: Oropharynx is clear and moist.  Eyes: Conjunctivae and EOM are normal. Pupils are equal, round, and reactive to light.  Cardiovascular: Normal rate and regular rhythm.   Pulmonary/Chest: Effort normal and breath sounds normal.  Abdominal: Soft. Bowel sounds are normal.  Musculoskeletal: Normal range of motion.  Neurological: She is alert and oriented to person, place, and time.  Skin: Skin is warm and dry.  Nursing note and vitals reviewed.   ED Course  Procedures (including critical care time) Labs Review Labs Reviewed - No data to display  Imaging Review Dg Chest 2 View  03/18/2015   CLINICAL DATA:  Central chest pain and burning sensation in throat.  EXAM: CHEST - 2 VIEW  COMPARISON:  Report from a prior study at [facility] dated 07/12/2013. This chest x-ray currently is not able to be retrieved.  FINDINGS: The heart size and mediastinal contours are within normal limits. There is mild elevation of the anterior right hemidiaphragm. There is no evidence of pulmonary edema, consolidation, pneumothorax, nodule or pleural  fluid. The visualized skeletal structures are unremarkable.  IMPRESSION: No active disease.   Electronically Signed   By: Irish LackGlenn  Yamagata M.D.   On: 03/18/2015 19:29     EKG Interpretation   Date/Time:  Wednesday March 18 2015 18:40:42 EDT Ventricular Rate:  96 PR Interval:  128 QRS Duration: 82 QT Interval:  328 QTC Calculation: 414 R Axis:   24 Text Interpretation:  Normal sinus rhythm Normal ECG Confirmed by BEATON   MD, ROBERT ([id number]) on 03/18/2015 6:47:49 PM      MDM   Final  diagnoses:  Other chest pain  Reflux    No pneumonia noted on x-ray. Discussed with pt the possibility of reflux and otc medications that may help    Teressa LowerVrinda Kristle Wesch, NP 03/18/15 2124  Teressa LowerVrinda Latonja Bobeck, NP 03/18/15 [id number]  Nelva Nayobert Beaton, MD 03/23/15 2202

## 2015-03-18 NOTE — Discharge Instructions (Signed)
Gastroesophageal Reflux Disease, Adult °Gastroesophageal reflux disease (GERD) happens when acid from your stomach goes into your food pipe (esophagus). The acid can cause a burning feeling in your chest. Over time, the acid can make small holes (ulcers) in your food pipe.  °HOME CARE °· Ask your doctor for advice about: °¨ Losing weight. °¨ Quitting smoking. °¨ Alcohol use. °· Avoid foods and drinks that make your problems worse. You may want to avoid: °¨ Caffeine and alcohol. °¨ Chocolate. °¨ Mints. °¨ Garlic and onions. °¨ Spicy foods. °¨ Citrus fruits, such as oranges, lemons, or limes. °¨ Foods that contain tomato, such as sauce, chili, salsa, and pizza. °¨ Fried and fatty foods. °· Avoid lying down for 3 hours before you go to bed or before you take a nap. °· Eat small meals often, instead of large meals. °· Wear loose-fitting clothing. Do not wear anything tight around your waist. °· Raise (elevate) the head of your bed 6 to 8 inches with wood blocks. Using extra pillows does not help. °· Only take medicines as told by your doctor. °· Do not take aspirin or ibuprofen. °GET HELP RIGHT AWAY IF:  °· You have pain in your arms, neck, jaw, teeth, or back. °· Your pain gets worse or changes. °· You feel sick to your stomach (nauseous), throw up (vomit), or sweat (diaphoresis). °· You feel short of breath, or you pass out (faint). °· Your throw up is green, yellow, black, or looks like coffee grounds or blood. °· Your poop (stool) is red, bloody, or black. °MAKE SURE YOU:  °· Understand these instructions. °· Will watch your condition. °· Will get help right away if you are not doing well or get worse. °Document Released: 03/21/2008 Document Revised: 12/26/2011 Document Reviewed: 04/22/2011 °ExitCare® Patient Information ©2015 [employer]. This information is not intended to replace advice given to you by your health care provider. Make sure you discuss any questions you have with your health care provider. ° °

## 2015-03-18 NOTE — ED Notes (Signed)
Patient states she has had central chest pain with throat burning and a headache for the last three days.  States the symptoms are associated with lightheadedness and dizziness.  States she went to [facility] this morning at 0200, but did not wait due to the long wait.

## 2016-01-27 ENCOUNTER — Encounter (HOSPITAL_BASED_OUTPATIENT_CLINIC_OR_DEPARTMENT_OTHER): Payer: Self-pay | Admitting: Emergency Medicine

## 2016-01-27 ENCOUNTER — Emergency Department (HOSPITAL_BASED_OUTPATIENT_CLINIC_OR_DEPARTMENT_OTHER)
Admission: EM | Admit: 2016-01-27 | Discharge: 2016-01-27 | Disposition: A | Discharge status: Home / Self Care | Payer: Medicaid Other | Attending: Emergency Medicine | Admitting: Emergency Medicine

## 2016-01-27 ENCOUNTER — Emergency Department (HOSPITAL_BASED_OUTPATIENT_CLINIC_OR_DEPARTMENT_OTHER): Payer: Medicaid Other

## 2016-01-27 DIAGNOSIS — R1011 Right upper quadrant pain: Secondary | ICD-10-CM

## 2016-01-27 DIAGNOSIS — Z3202 Encounter for pregnancy test, result negative: Secondary | ICD-10-CM | POA: Insufficient documentation

## 2016-01-27 DIAGNOSIS — R110 Nausea: Secondary | ICD-10-CM | POA: Insufficient documentation

## 2016-01-27 DIAGNOSIS — R630 Anorexia: Secondary | ICD-10-CM | POA: Diagnosis not present

## 2016-01-27 DIAGNOSIS — Z792 Long term (current) use of antibiotics: Secondary | ICD-10-CM | POA: Insufficient documentation

## 2016-01-27 DIAGNOSIS — R1010 Upper abdominal pain, unspecified: Secondary | ICD-10-CM | POA: Insufficient documentation

## 2016-01-27 DIAGNOSIS — R1084 Generalized abdominal pain: Secondary | ICD-10-CM | POA: Diagnosis present

## 2016-01-27 LAB — CBC WITH DIFFERENTIAL/PLATELET
Basophils Absolute: 0 10*3/uL (ref 0.0–0.1)
Basophils Relative: 0 %
EOS ABS: 0.1 10*3/uL (ref 0.0–0.7)
Eosinophils Relative: 1 %
HEMATOCRIT: 43.1 % (ref 36.0–46.0)
Hemoglobin: 14.6 g/dL (ref 12.0–15.0)
Lymphocytes Relative: 20 %
Lymphs Abs: 1.7 10*3/uL (ref 0.7–4.0)
MCH: 29.6 pg (ref 26.0–34.0)
MCHC: 33.9 g/dL (ref 30.0–36.0)
MCV: 87.4 fL (ref 78.0–100.0)
Monocytes Absolute: 0.5 10*3/uL (ref 0.1–1.0)
Monocytes Relative: 6 %
NEUTROS PCT: 73 %
Neutro Abs: 6.4 10*3/uL (ref 1.7–7.7)
Platelets: 304 10*3/uL (ref 150–400)
RBC: 4.93 MIL/uL (ref 3.87–5.11)
RDW: 14.1 % (ref 11.5–15.5)
WBC: 8.8 10*3/uL (ref 4.0–10.5)

## 2016-01-27 LAB — COMPREHENSIVE METABOLIC PANEL
ALT: 19 U/L (ref 14–54)
ANION GAP: 5 (ref 5–15)
AST: 17 U/L (ref 15–41)
Albumin: 3.2 g/dL — ABNORMAL LOW (ref 3.5–5.0)
Alkaline Phosphatase: 98 U/L (ref 38–126)
BUN: 8 mg/dL (ref 6–20)
CHLORIDE: 106 mmol/L (ref 101–111)
CO2: 27 mmol/L (ref 22–32)
CREATININE: 0.73 mg/dL (ref 0.44–1.00)
Calcium: 9.1 mg/dL (ref 8.9–10.3)
Glucose, Bld: 96 mg/dL (ref 65–99)
POTASSIUM: 4.1 mmol/L (ref 3.5–5.1)
Sodium: 138 mmol/L (ref 135–145)
Total Bilirubin: 0.1 mg/dL — ABNORMAL LOW (ref 0.3–1.2)
Total Protein: 7.5 g/dL (ref 6.5–8.1)

## 2016-01-27 LAB — URINALYSIS, ROUTINE W REFLEX MICROSCOPIC
Bilirubin Urine: NEGATIVE
GLUCOSE, UA: NEGATIVE mg/dL
Ketones, ur: NEGATIVE mg/dL
LEUKOCYTES UA: NEGATIVE
Nitrite: NEGATIVE
PROTEIN: NEGATIVE mg/dL
SPECIFIC GRAVITY, URINE: 1.018 (ref 1.005–1.030)
pH: 6.5 (ref 5.0–8.0)

## 2016-01-27 LAB — PREGNANCY, URINE: PREG TEST UR: NEGATIVE

## 2016-01-27 LAB — URINE MICROSCOPIC-ADD ON: WBC, UA: NONE SEEN WBC/hpf (ref 0–5)

## 2016-01-27 LAB — LIPASE, BLOOD: LIPASE: 18 U/L (ref 11–51)

## 2016-01-27 MED ORDER — GI COCKTAIL ~~LOC~~
30.0000 mL | Freq: Once | ORAL | Status: AC
  Administered 2016-01-27: 30 mL via ORAL
  Filled 2016-01-27: qty 30

## 2016-01-27 NOTE — ED Notes (Signed)
Patient c/o generalized abd pain x1 day, slight nausea, diarrhea x2 in prior 24 hours. 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 Patient denies urinary symptoms, denies vaginal discharge. Patient has not used any medications to treat this problem.

## 2016-01-27 NOTE — ED Notes (Signed)
Patient to remain NPO at this time, patient is aware.

## 2016-01-27 NOTE — Discharge Instructions (Signed)
We saw you in the ER for the abdominal pain. °All of our results are normal, including all labs and imaging. Kidney function is fine as well. °We are not sure what is causing your abdominal pain, and recommend that you see your primary care doctor within 2-3 days for further evaluation. °If your symptoms get worse, return to the ER. °Take the pain meds and nausea meds as prescribed. ° ° °Abdominal Pain, Adult °Many things can cause abdominal pain. Usually, abdominal pain is not caused by a disease and will improve without treatment. It can often be observed and treated at home. Your health care provider will do a physical exam and possibly order blood tests and X-rays to help determine the seriousness of your pain. However, in many cases, more time must pass before a clear cause of the pain can be found. Before that point, your health care provider may not know if you need more testing or further treatment. °HOME CARE INSTRUCTIONS °Monitor your abdominal pain for any changes. The following actions may help to alleviate any discomfort you are experiencing: °· Only take over-the-counter or prescription medicines as directed by your health care provider. °· Do not take laxatives unless directed to do so by your health care provider. °· Try a clear liquid diet (broth, tea, or water) as directed by your health care provider. Slowly move to a bland diet as tolerated. °SEEK MEDICAL CARE IF: °· You have unexplained abdominal pain. °· You have abdominal pain associated with nausea or diarrhea. °· You have pain when you urinate or have a bowel movement. °· You experience abdominal pain that wakes you in the night. °· You have abdominal pain that is worsened or improved by eating food. °· You have abdominal pain that is worsened with eating fatty foods. °· You have a fever. °SEEK IMMEDIATE MEDICAL CARE IF: °· Your pain does not go away within 2 hours. °· You keep throwing up (vomiting). °· Your pain is felt only in portions of  the abdomen, such as the right side or the left lower portion of the abdomen. °· You pass bloody or black tarry stools. °MAKE SURE YOU: °· Understand these instructions. °· Will watch your condition. °· Will get help right away if you are not doing well or get worse. °  °This information is not intended to replace advice given to you by your health care provider. Make sure you discuss any questions you have with your health care provider. °  °Document Released: 07/13/2005 Document Revised: 06/24/2015 Document Reviewed: 06/12/2013 °Elsevier Interactive Patient Education ©2016 Elsevier Inc. ° °

## 2016-01-27 NOTE — ED Notes (Signed)
MD at bedside. 

## 2016-01-27 NOTE — ED Provider Notes (Signed)
CSN: [id number]     Arrival date & time 01/27/16  1055 History   First MD Initiated Contact with Patient 01/27/16 1101     Chief Complaint  Patient presents with  . Abdominal Pain    generalized     (Consider location/radiation/quality/duration/timing/severity/associated sxs/prior Treatment) Patient is a 25 y.o. female presenting with abdominal pain. The history is provided by the patient.  Abdominal Pain Pain location:  Generalized Pain quality: aching and cramping   Pain radiates to:  Does not radiate Pain severity:  Moderate Onset quality:  Gradual Duration:  1 day Timing:  Constant Progression:  Waxing and waning Chronicity:  New Context: not alcohol use   Relieved by:  Nothing Worsened by:  Nothing tried Associated symptoms: anorexia and nausea     Past Medical History  Diagnosis Date  . Hypertension     when pregnant   Past Surgical History  Procedure Laterality Date  . Facial fracture surgery     History reviewed. No pertinent family history. Social History  Substance Use Topics  . Smoking status: Never Smoker   . Smokeless tobacco: None  . Alcohol Use: No   OB History    No data available     Review of Systems  Gastrointestinal: Positive for nausea, abdominal pain and anorexia.  All other systems reviewed and are negative.     Allergies  Review of patient's allergies indicates no known allergies.  Home Medications   Prior to Admission medications   Medication Sig Start Date End Date Taking? Authorizing Provider  cephALEXin (KEFLEX) 500 MG capsule Take 1 capsule (500 mg total) by mouth 4 (four) times daily. 09/08/13   Elpidio Anis, PA-C  cephALEXin (KEFLEX) 500 MG capsule Take 1 capsule (500 mg total) by mouth 4 (four) times daily. 03/12/14   Teressa Lower, NP  metroNIDAZOLE (FLAGYL) 500 MG tablet Take 1 tablet (500 mg total) by mouth 2 (two) times daily. 11/09/13   Hope Orlene Och, NP  sulfamethoxazole-trimethoprim (SEPTRA DS) 800-160 MG per  tablet Take 1 tablet by mouth every 12 (twelve) hours. 03/12/14   Teressa Lower, NP  Vitamin D, Ergocalciferol, (DRISDOL) 50000 UNITS CAPS capsule Take 50,000 Units by mouth every 7 (seven) days.    Historical Provider, MD   BP 156/100 mmHg  Pulse 100  Temp(Src) 99 F (37.2 C) (Oral)  Resp 18  Ht  (1.651 m)  Wt 330 lb 11.2 oz (150.005 kg)  BMI 55.03 kg/m2  SpO2 100%  LMP 01/19/2016 (Exact Date) Physical Exam  Constitutional: She is oriented to person, place, and time. She appears well-developed.  HENT:  Head: Normocephalic and atraumatic.  Eyes: EOM are normal.  Neck: Normal range of motion. Neck supple.  Cardiovascular: Normal rate.   Pulmonary/Chest: Effort normal.  Abdominal: Bowel sounds are normal. There is tenderness.  Neurological: She is alert and oriented to person, place, and time.  Skin: Skin is warm and dry.  Nursing note and vitals reviewed.   ED Course  Procedures (including critical care time) Labs Review Labs Reviewed  COMPREHENSIVE METABOLIC PANEL - Abnormal; Notable for the following:    Albumin 3.2 (*)    Total Bilirubin 0.1 (*)    All other components within normal limits  URINALYSIS, ROUTINE W REFLEX MICROSCOPIC (NOT AT [facility]) - Abnormal; Notable for the following:    Hgb urine dipstick SMALL (*)    All other components within normal limits  URINE MICROSCOPIC-ADD ON - Abnormal; Notable for the following:    Squamous  Epithelial / LPF 0-5 (*)    Bacteria, UA RARE (*)    All other components within normal limits  CBC WITH DIFFERENTIAL/PLATELET  LIPASE, BLOOD  PREGNANCY, URINE    Imaging Review [place] Abdomen Limited Ruq  01/27/2016  CLINICAL DATA:  RIGHT upper quadrant pain.  Symptoms for 1 day. EXAM: US ABDOMEN LIMITED - RIGHT UPPER QUADRANT COMPARISON:  None. FINDINGS: Gallbladder: No gallstones or wall thickening visualized. No sonographic Murphy sign noted by sonographer. Common bile duct: Diameter: 1.5 mm. Liver: No focal lesion identified.  Within normal limits in parenchymal echogenicity. Some limited visualization due to bowel gas. IMPRESSION: Negative exam. Electronically Signed   By: Elsie StainJohn T Curnes M.D.   On: 01/27/2016 14:42   I have personally reviewed and evaluated these images and lab results as part of my medical decision-making.   EKG Interpretation None      MDM   Final diagnoses:  Pain of upper abdomen    Abd pain, epigastric, R sided, generalized. Abd exam is non peritoneal. Labs are non specific. US abd is neg. Will d/c.   Derwood KaplanAnkit Parrish Daddario, MD 01/27/16 [phone]

## 2016-10-04 IMAGING — US US ABDOMEN LIMITED
1 series · 14 of 25 positions shown · non-contrast
Comparison: None.

CLINICAL DATA: RIGHT upper quadrant pain.  Symptoms for 1 day.

EXAM:
US ABDOMEN LIMITED - RIGHT UPPER QUADRANT

[Series 1: us abdomen limited · 0.17mm/px · 14 of 44 slices shown]
[im 1/44]
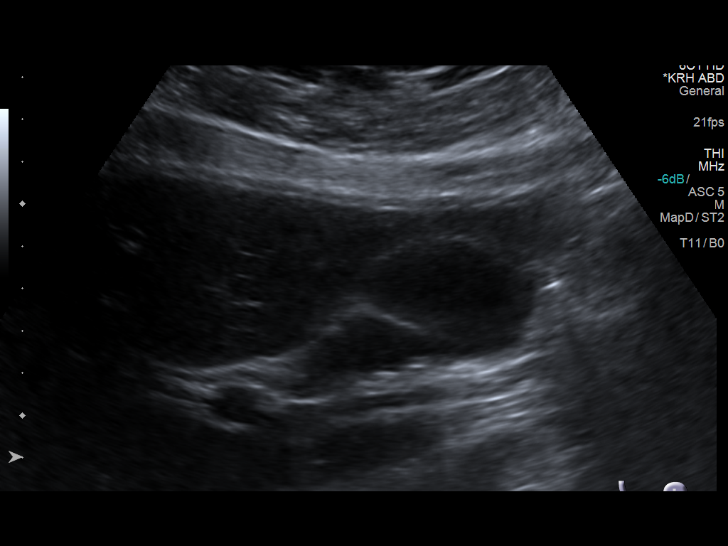
[im 4/44]
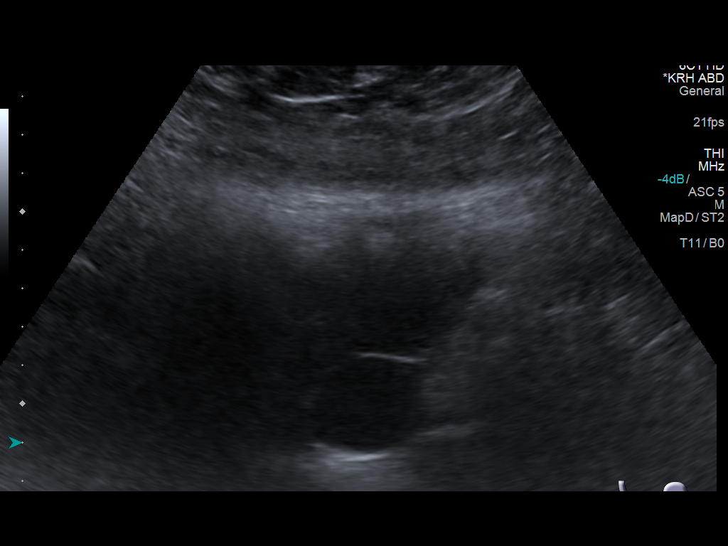
[im 8/44]
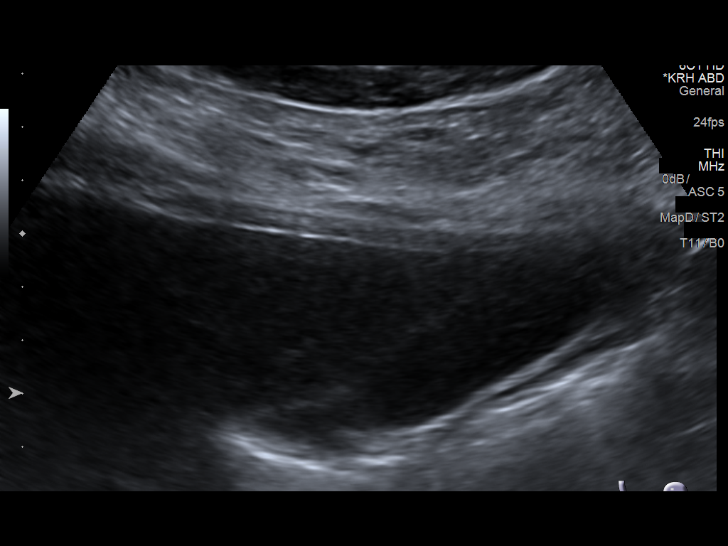
[im 11/44]
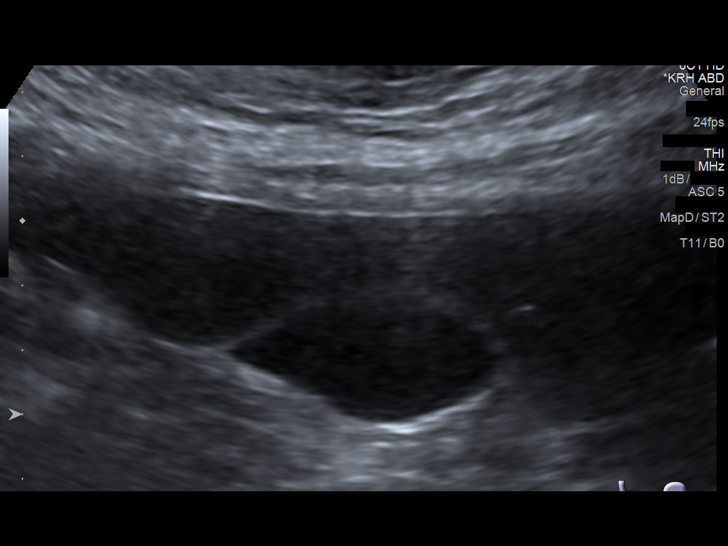
[im 15/44]
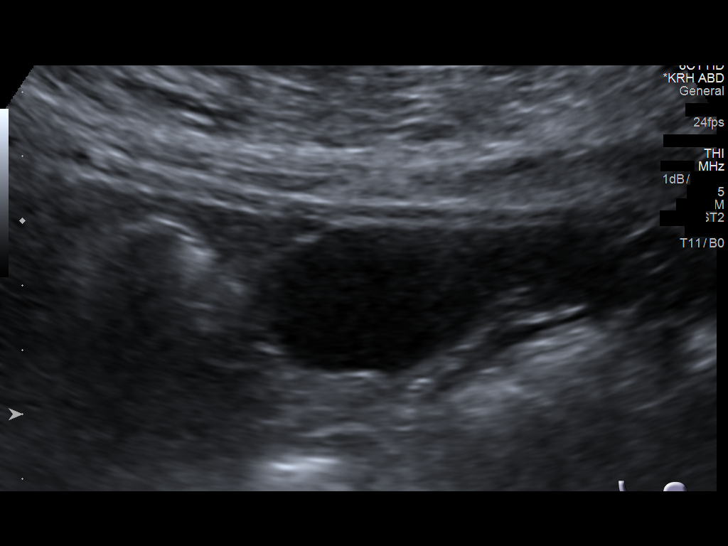
[im 17/44]
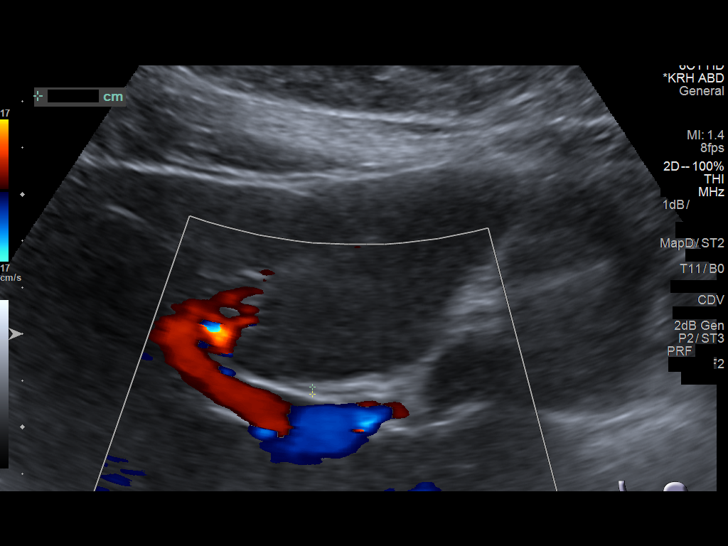
[im 20/44]
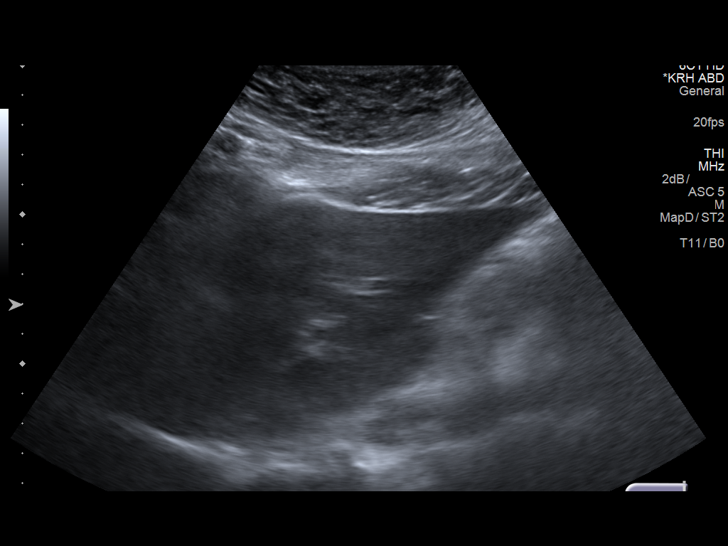
[im 24/44]
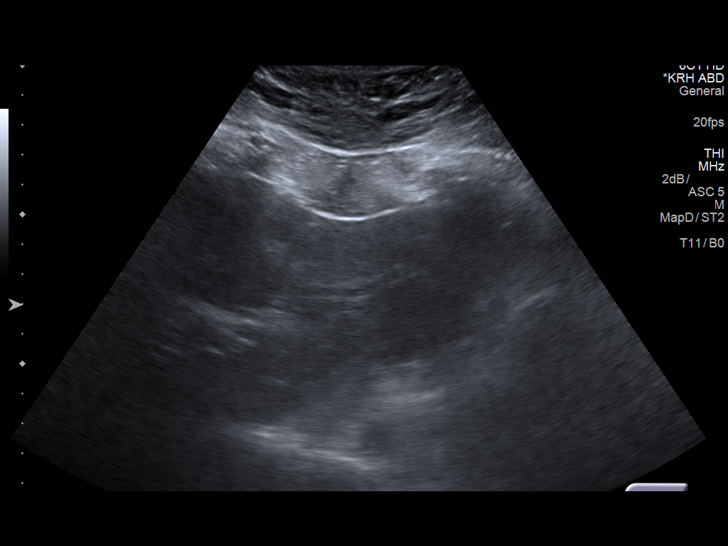
[im 27/44]
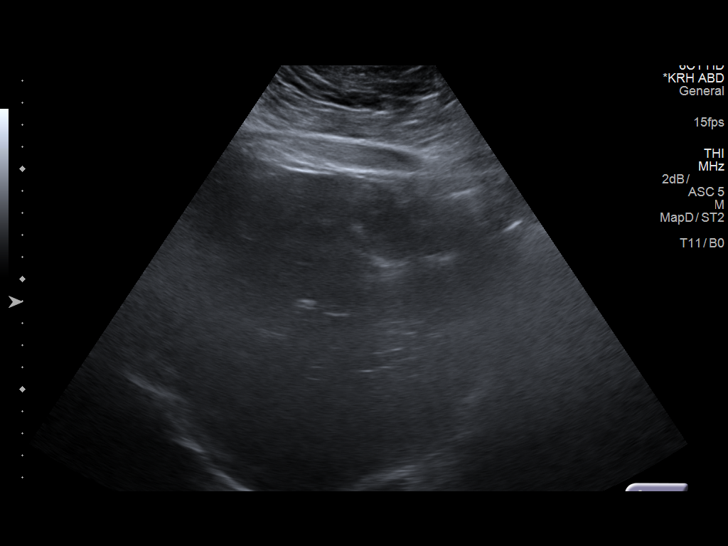
[im 29/44]
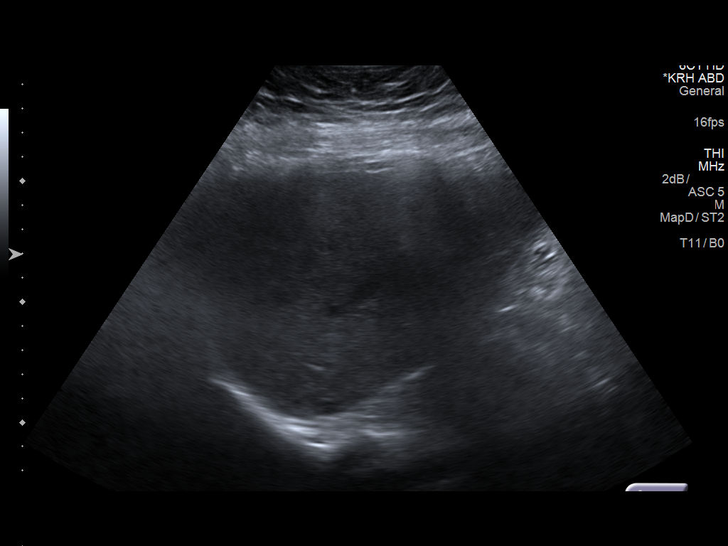
[im 33/44]
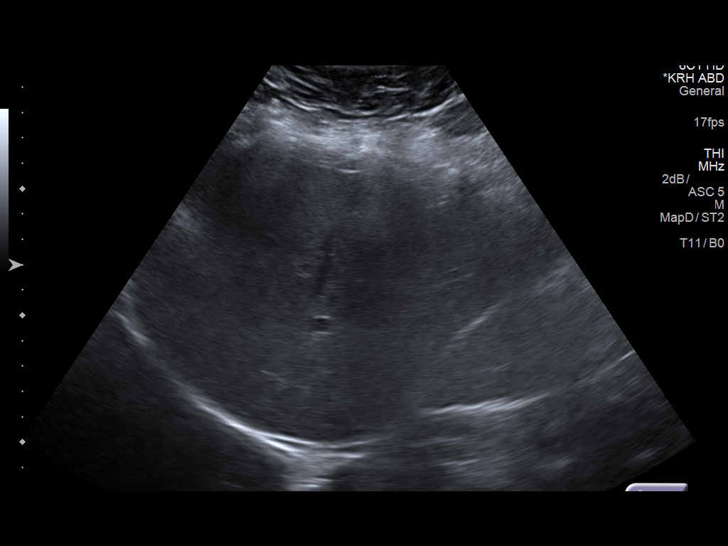
[im 36/44]
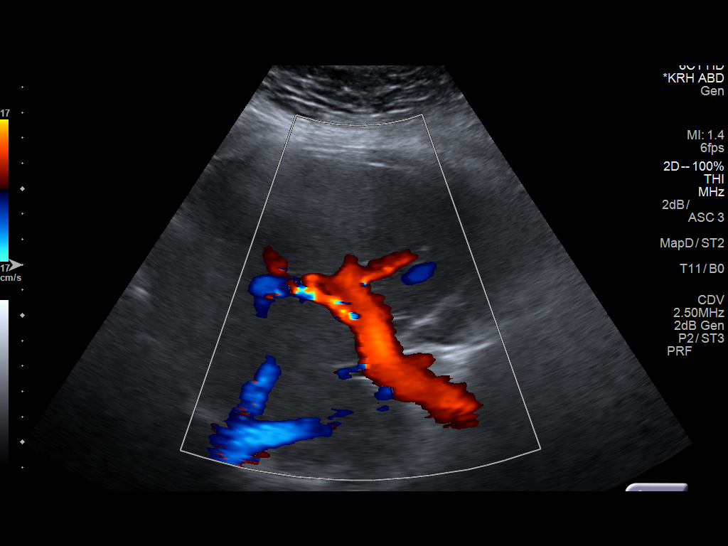
[im 40/44]
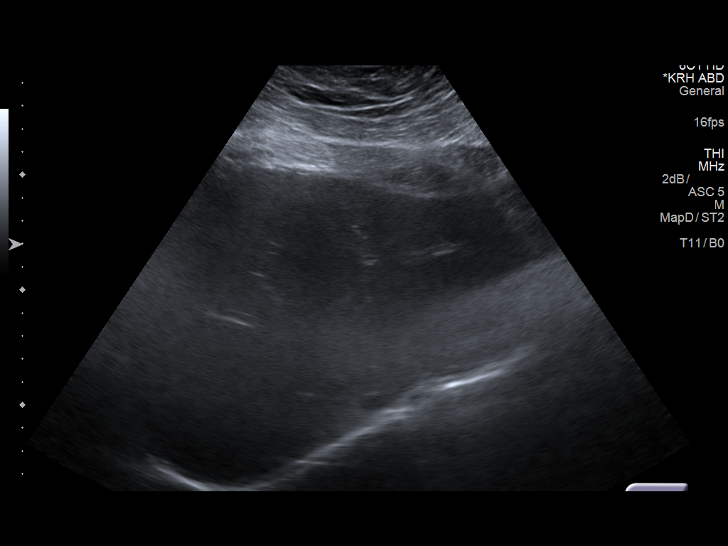
[im 44/44]
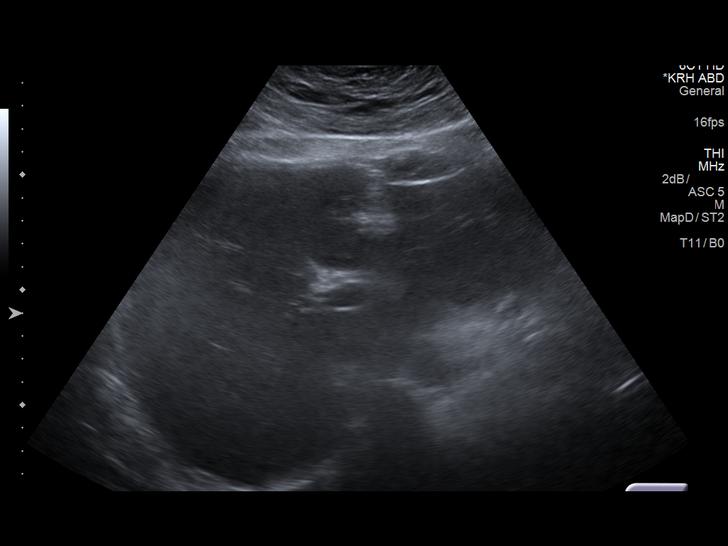

[14 of 25 positions shown; findings below may reference images not displayed]

FINDINGS: Gallbladder:

No gallstones or wall thickening visualized. No sonographic Murphy
sign noted by sonographer.

Common bile duct:

Diameter: 1.5 mm.

Liver:

No focal lesion identified. Within normal limits in parenchymal
echogenicity. Some limited visualization due to bowel gas.
IMPRESSION: Negative exam.

## 2018-03-18 ENCOUNTER — Emergency Department (HOSPITAL_BASED_OUTPATIENT_CLINIC_OR_DEPARTMENT_OTHER)
Admission: EM | Admit: 2018-03-18 | Discharge: 2018-03-18 | Disposition: A | Discharge status: Home / Self Care | Payer: Medicaid Other | Attending: Emergency Medicine | Admitting: Emergency Medicine

## 2018-03-18 ENCOUNTER — Other Ambulatory Visit: Payer: Self-pay

## 2018-03-18 ENCOUNTER — Encounter (HOSPITAL_BASED_OUTPATIENT_CLINIC_OR_DEPARTMENT_OTHER): Payer: Self-pay | Admitting: Emergency Medicine

## 2018-03-18 DIAGNOSIS — N3001 Acute cystitis with hematuria: Secondary | ICD-10-CM | POA: Insufficient documentation

## 2018-03-18 DIAGNOSIS — R3915 Urgency of urination: Secondary | ICD-10-CM | POA: Diagnosis not present

## 2018-03-18 DIAGNOSIS — M545 Low back pain: Secondary | ICD-10-CM | POA: Diagnosis present

## 2018-03-18 LAB — URINALYSIS, MICROSCOPIC (REFLEX)

## 2018-03-18 LAB — URINALYSIS, ROUTINE W REFLEX MICROSCOPIC
BILIRUBIN URINE: NEGATIVE
Glucose, UA: NEGATIVE mg/dL
Ketones, ur: NEGATIVE mg/dL
Nitrite: NEGATIVE
PH: 6.5 (ref 5.0–8.0)
Protein, ur: NEGATIVE mg/dL
SPECIFIC GRAVITY, URINE: 1.02 (ref 1.005–1.030)

## 2018-03-18 LAB — PREGNANCY, URINE: PREG TEST UR: NEGATIVE

## 2018-03-18 MED ORDER — CEPHALEXIN 250 MG PO CAPS
500.0000 mg | ORAL_CAPSULE | Freq: Once | ORAL | Status: AC
  Administered 2018-03-18: 500 mg via ORAL
  Filled 2018-03-18: qty 2

## 2018-03-18 MED ORDER — TRAMADOL HCL 50 MG PO TABS
50.0000 mg | ORAL_TABLET | Freq: Once | ORAL | Status: AC
  Administered 2018-03-18: 50 mg via ORAL
  Filled 2018-03-18: qty 1

## 2018-03-18 MED ORDER — CEPHALEXIN 500 MG PO CAPS: 500.0000 mg | ORAL_CAPSULE | Freq: Two times a day (BID) | ORAL | 0 refills | Status: AC

## 2018-03-18 NOTE — Discharge Instructions (Signed)
You need to make an appointment to follow-up with Dr. Shawnie Ponsorn.  Your blood pressure needs to be rechecked.  He had some slight hematuria which also needs to be rechecked to make sure that the blood clears from your urine.  Return the emergency room if you have any worsening symptoms.

## 2018-03-18 NOTE — ED Provider Notes (Signed)
[facility] EMERGENCY DEPARTMENT Provider Note   CSN: [id number] Arrival date & time: 03/18/18  1926     History   Chief Complaint Chief Complaint  Patient presents with  . Back Pain    HPI Sheryl Little is a 27 y.o. female.  Patient is a 27 year old female who presents with possible UTI.  She states over the last week she has had some urinary symptoms with bladder pressure and urinary urgency.  She took some over-the-counter cranberry pills and states that his symptoms have improved but she now has some pain across her low back and some discomfort in her lower abdomen.  She has no nausea or vomiting.  No fevers.  No change in bowel habits.  No vaginal bleeding or discharge.  No vaginal pain or discomfort.  She is sexually active but uses condoms.     Past Medical History:  Diagnosis Date  . Hypertension    when pregnant    There are no active problems to display for this patient.   Past Surgical History:  Procedure Laterality Date  . FACIAL FRACTURE SURGERY       OB History   None      Home Medications    Prior to Admission medications   Medication Sig Start Date End Date Taking? Authorizing Provider  cephALEXin (KEFLEX) 500 MG capsule Take 1 capsule (500 mg total) by mouth 2 (two) times daily. 03/18/18   Rolan BuccoBelfi, Isaac Lacson, MD  metroNIDAZOLE (FLAGYL) 500 MG tablet Take 1 tablet (500 mg total) by mouth 2 (two) times daily. 11/09/13   Janne NapoleonNeese, Hope M, NP  sulfamethoxazole-trimethoprim (SEPTRA DS) 800-160 MG per tablet Take 1 tablet by mouth every 12 (twelve) hours. 03/12/14   Teressa LowerPickering, Vrinda, NP  Vitamin D, Ergocalciferol, (DRISDOL) 50000 UNITS CAPS capsule Take 50,000 Units by mouth every 7 (seven) days.    [provider]    Family History History reviewed. No pertinent family history.  Social History Social History   Tobacco Use  . Smoking status: Never Smoker  . Smokeless tobacco: Never Used  Substance Use Topics  . Alcohol use: No  .  Drug use: No     Allergies   Patient has no known allergies.   Review of Systems Review of Systems  Constitutional: Negative for chills, diaphoresis, fatigue and fever.  HENT: Negative for congestion, rhinorrhea and sneezing.   Eyes: Negative.   Respiratory: Negative for cough, chest tightness and shortness of breath.   Cardiovascular: Negative for chest pain and leg swelling.  Gastrointestinal: Positive for abdominal pain. Negative for blood in stool, diarrhea, nausea and vomiting.  Genitourinary: Positive for dysuria and frequency. Negative for difficulty urinating, flank pain and hematuria.  Musculoskeletal: Positive for back pain. Negative for arthralgias.  Skin: Negative for rash.  Neurological: Negative for dizziness, speech difficulty, weakness, numbness and headaches.     Physical Exam Updated Vital Signs BP (!) 164/130 (BP Location: Right Arm)   Pulse (!) 105   Temp 98.2 F (36.8 C) (Oral)   Resp 18   Ht 5\' 4"  (1.626 m)   Wt (!) 163.3 kg (360 lb)   LMP 03/04/2018   SpO2 100%   BMI 61.79 kg/m   Physical Exam  Constitutional: She is oriented to person, place, and time. She appears well-developed and well-nourished.  HENT:  Head: Normocephalic and atraumatic.  Eyes: Pupils are equal, round, and reactive to light.  Neck: Normal range of motion. Neck supple.  Cardiovascular: Normal rate, regular rhythm and normal  heart sounds.  Pulmonary/Chest: Effort normal and breath sounds normal. No respiratory distress. She has no wheezes. She has no rales. She exhibits no tenderness.  Abdominal: Soft. Bowel sounds are normal. There is tenderness (Mild tenderness across the lower abdomen). There is no rebound and no guarding.  No CVA tenderness, no spinal tenderness  Musculoskeletal: Normal range of motion. She exhibits no edema.  Lymphadenopathy:    She has no cervical adenopathy.  Neurological: She is alert and oriented to person, place, and time.  Skin: Skin is warm and  dry. No rash noted.  Psychiatric: She has a normal mood and affect.     ED Treatments / Results  Labs (all labs ordered are listed, but only abnormal results are displayed) Labs Reviewed  URINALYSIS, ROUTINE W REFLEX MICROSCOPIC - Abnormal; Notable for the following components:      Result Value   Hgb urine dipstick SMALL (*)    Leukocytes, UA SMALL (*)    All other components within normal limits  URINALYSIS, MICROSCOPIC (REFLEX) - Abnormal; Notable for the following components:   Bacteria, UA MANY (*)    All other components within normal limits  URINE CULTURE  PREGNANCY, URINE    EKG None  Radiology No results found.  Procedures Procedures (including critical care time)  Medications Ordered in ED Medications  cephALEXin (KEFLEX) capsule 500 mg (has no administration in time range)  traMADol (ULTRAM) tablet 50 mg (has no administration in time range)     Initial Impression / Assessment and Plan / ED Course  I have reviewed the triage vital signs and the nursing notes.  Pertinent labs & imaging results that were available during my care of the patient were reviewed by me and considered in my medical decision making (see chart for details).     Patient is a 27 year old female who presents with urinary symptoms associate with some low back pain and lower abdominal pain.  Her urine does appear to be consistent with infection.  She does not have any vaginal complaints that would be more concerning for a vaginal infection.  She does not have any suggestions of pyelonephritis.  Her heart rate was normal on my exam.  Her blood pressure had improved but was still little bit elevated at 153/70.  She did have some slight lead in her urine.  This could be related to the infection.  She does not have any unilateral abdominal pain or other symptoms that would be more concerning for renal colic.  She does say that she is starting to have some slight spotting as her period is about to  come on and the hematuria may be some contamination in this as well.  Her urine was sent for culture.  She was started on Keflex.  I encouraged her to have a follow-up appointment with her PCP to recheck her blood pressure and have her urine rechecked to make sure the blood clears once the infection has gone.  I did advise her if she has any worsening abdominal pain, vomiting or fevers that she needs to return to the emergency department.  Final Clinical Impressions(s) / ED Diagnoses   Final diagnoses:  Acute cystitis with hematuria    ED Discharge Orders        Ordered    cephALEXin (KEFLEX) 500 MG capsule  2 times daily     03/18/18 2241       Rolan Bucco, MD 03/18/18 2245

## 2018-03-18 NOTE — ED Notes (Signed)
PT discharged to home NAD.  

## 2018-03-18 NOTE — ED Triage Notes (Signed)
Patient states that she jas pressure in her bladder last week. The patient states that she now is having bilateral lower back pain and cramping in her lower stomach

## 2018-03-21 LAB — URINE CULTURE: Culture: >=100000 — AB

## 2018-03-22 ENCOUNTER — Telehealth: Payer: Self-pay | Admitting: *Deleted

## 2018-03-22 NOTE — Telephone Encounter (Signed)
Post ED Visit - Positive Culture Follow-up  Culture report reviewed by antimicrobial stewardship pharmacist:  []  Sheryl Little, Pharm.D. []  Celedonio MiyamotoJeremy Frens, Pharm.D., BCPS AQ-ID []  Garvin FilaMike Maccia, Pharm.D., BCPS []  Georgina PillionElizabeth Martin, [address] BCPS []  TunicaMinh Pham, [address]Ph[address]D., BCPS, AAHIVP []  Estella HuskMichelle Turner, Pharm.D., BCPS, AAHIVP []  Lysle Pearlachel Rumbarger, PharmD, BCPS []  Sherlynn CarbonAustin Lucas, PharmD []  Pollyann SamplesAndy Johnston, PharmD, BCPS Ladell PierBrooke Baggett, PharmD  Positive urine culture Treated with Cephalexin, organism sensitive to the same and no further patient follow-up is required at this time.  Virl AxeRobertson, Srah Ake [facility] 03/22/2018, 11:02 AM
